# Patient Record
Sex: Female | Born: 1995 | Race: White | Hispanic: No | Marital: Single | Smoking: Never smoker
Health system: Southern US, Community
[De-identification: ages and names within clinical notes are randomized; demographics above are authoritative.]

## PROBLEM LIST (undated history)

## (undated) DIAGNOSIS — K589 Irritable bowel syndrome without diarrhea: Secondary | ICD-10-CM

---

## 2017-07-12 ENCOUNTER — Encounter (HOSPITAL_COMMUNITY): Payer: Self-pay | Admitting: Emergency Medicine

## 2017-07-12 ENCOUNTER — Emergency Department (HOSPITAL_COMMUNITY): Payer: BLUE CROSS/BLUE SHIELD

## 2017-07-12 ENCOUNTER — Other Ambulatory Visit: Payer: Self-pay

## 2017-07-12 ENCOUNTER — Emergency Department (HOSPITAL_COMMUNITY)
Admission: EM | Admit: 2017-07-12 | Discharge: 2017-07-12 | Disposition: A | Discharge status: Home / Self Care | Payer: BLUE CROSS/BLUE SHIELD | Attending: Emergency Medicine | Admitting: Emergency Medicine

## 2017-07-12 DIAGNOSIS — R112 Nausea with vomiting, unspecified: Secondary | ICD-10-CM | POA: Insufficient documentation

## 2017-07-12 DIAGNOSIS — R1110 Vomiting, unspecified: Secondary | ICD-10-CM | POA: Insufficient documentation

## 2017-07-12 DIAGNOSIS — K5900 Constipation, unspecified: Secondary | ICD-10-CM | POA: Diagnosis not present

## 2017-07-12 DIAGNOSIS — R1084 Generalized abdominal pain: Secondary | ICD-10-CM | POA: Diagnosis present

## 2017-07-12 HISTORY — DX: Irritable bowel syndrome, unspecified: K58.9

## 2017-07-12 LAB — COMPREHENSIVE METABOLIC PANEL
ALBUMIN: 4.4 g/dL (ref 3.5–5.0)
ALK PHOS: 45 U/L (ref 38–126)
ALT: 18 U/L (ref 14–54)
ANION GAP: 8 (ref 5–15)
AST: 25 U/L (ref 15–41)
BUN: 15 mg/dL (ref 6–20)
CALCIUM: 9 mg/dL (ref 8.9–10.3)
CO2: 26 mmol/L (ref 22–32)
Chloride: 104 mmol/L (ref 101–111)
Creatinine, Ser: 0.87 mg/dL (ref 0.44–1.00)
GFR calc Af Amer: >60 mL/min (ref >60)
GFR calc non Af Amer: >60 mL/min (ref >60)
GLUCOSE: 106 mg/dL — AB (ref 65–99)
Potassium: 3.9 mmol/L (ref 3.5–5.1)
SODIUM: 138 mmol/L (ref 135–145)
Total Bilirubin: 0.7 mg/dL (ref 0.3–1.2)
Total Protein: 6.7 g/dL (ref 6.5–8.1)

## 2017-07-12 LAB — CBC
HCT: 42.1 % (ref 36.0–46.0)
HEMOGLOBIN: 13.9 g/dL (ref 12.0–15.0)
MCH: 31.2 pg (ref 26.0–34.0)
MCHC: 33 g/dL (ref 30.0–36.0)
MCV: 94.4 fL (ref 78.0–100.0)
Platelets: 194 10*3/uL (ref 150–400)
RBC: 4.46 MIL/uL (ref 3.87–5.11)
RDW: 12.9 % (ref 11.5–15.5)
WBC: 10.3 10*3/uL (ref 4.0–10.5)

## 2017-07-12 LAB — URINALYSIS, ROUTINE W REFLEX MICROSCOPIC
Bilirubin Urine: NEGATIVE
GLUCOSE, UA: NEGATIVE mg/dL
Hgb urine dipstick: NEGATIVE
Ketones, ur: NEGATIVE mg/dL
Nitrite: NEGATIVE
PROTEIN: NEGATIVE mg/dL
Specific Gravity, Urine: 1.019 (ref 1.005–1.030)
pH: 6 (ref 5.0–8.0)

## 2017-07-12 LAB — LIPASE, BLOOD: Lipase: 23 U/L (ref 11–51)

## 2017-07-12 LAB — PREGNANCY, URINE: PREG TEST UR: NEGATIVE

## 2017-07-12 MED ORDER — SODIUM CHLORIDE 0.9 % IV BOLUS (SEPSIS)
1000.0000 mL | Freq: Once | INTRAVENOUS | Status: AC
  Administered 2017-07-12: 1000 mL via INTRAVENOUS

## 2017-07-12 MED ORDER — ONDANSETRON HCL 4 MG PO TABS: 4.0000 mg | ORAL_TABLET | Freq: Three times a day (TID) | ORAL | 0 refills | Status: AC | PRN

## 2017-07-12 MED ORDER — FLEET ENEMA 7-19 GM/118ML RE ENEM
1.0000 | ENEMA | Freq: Once | RECTAL | Status: AC
  Administered 2017-07-12: 1 via RECTAL
  Filled 2017-07-12: qty 1

## 2017-07-12 MED ORDER — PROMETHAZINE HCL 25 MG PO TABS
12.5000 mg | ORAL_TABLET | Freq: Once | ORAL | Status: AC
  Administered 2017-07-12: 12.5 mg via ORAL
  Filled 2017-07-12: qty 1

## 2017-07-12 MED ORDER — ONDANSETRON HCL 4 MG/2ML IJ SOLN
4.0000 mg | Freq: Once | INTRAMUSCULAR | Status: AC
  Administered 2017-07-12: 4 mg via INTRAVENOUS
  Filled 2017-07-12: qty 2

## 2017-07-12 MED ORDER — MORPHINE SULFATE (PF) 4 MG/ML IV SOLN
2.0000 mg | Freq: Once | INTRAVENOUS | Status: AC
  Administered 2017-07-12: 2 mg via INTRAVENOUS
  Filled 2017-07-12: qty 1

## 2017-07-12 NOTE — ED Triage Notes (Signed)
All over abd pain n/v since this am, has hx of IBS , last bm Monday then not again till this am and it was small, denies dysuria

## 2017-07-12 NOTE — ED Notes (Signed)
ED Provider at bedside. 

## 2017-07-12 NOTE — ED Provider Notes (Signed)
[facility] Provider Note   CSN: [id number] Arrival date & time: 07/12/17  1130     History   Chief Complaint Chief Complaint  Patient presents with  . Abdominal Pain    HPI Amy Sanchez is a 21 y.o. female.  HPI   Amy Sanchez is a 21 year old female with a history of IBS-C who presents emergency department for evaluation of generalized abdominal pain and vomiting.  States that abdominal pain woke her from sleep this morning.  Pain is generalized, 7/10 in severity and constant.  She describes pain as "cramping, burning, like my stomach is about to explode."  Pain is worsened with vomiting.  She states that it is improved with lying flat.  She had two episodes of vomitus this morning, denies hematemesis.  Took Zofran which she had at home and has not had vomiting since.  She states that she has been constipated, last bowel movement three days ago.  She does report that she had a small, hard stool which passed this morning.  Denies hematochezia or melena.  She states that she takes Linzess for her constipation daily which was prescribed to her by her gastroenterologist back in [place]. She [facility] [employer] [place]  Lives in the dorms, denies known sick contacts. She denies previous abdominal surgeries.  Denies fever, urinary frequency, dysuria, hematuria, vaginal discharge, chest pain, shortness of breath, headache, visual disturbance.  Past Medical History:  Diagnosis Date  . IBS (irritable bowel syndrome)     There are no active problems to display for this patient.   History reviewed. No pertinent surgical history.  OB History    No data available       Home Medications    Prior to Admission medications   Not on File    Family History No family history on file.  Social History Social History   Tobacco Use  . Smoking status: Never Smoker  . Smokeless tobacco: Never Used  Substance Use Topics  .  Alcohol use: Yes  . Drug use: No     Allergies   Patient has no known allergies.   Review of Systems Review of Systems  Constitutional: Negative for chills, fatigue and fever.  HENT: Negative for trouble swallowing.   Eyes: Negative for visual disturbance.  Respiratory: Negative for shortness of breath.   Cardiovascular: Negative for chest pain.  Gastrointestinal: Positive for abdominal pain, constipation, nausea and vomiting. Negative for blood in stool and diarrhea.  Genitourinary: Negative for difficulty urinating, dysuria, flank pain, frequency and hematuria.  Musculoskeletal: Negative for back pain.  Skin: Negative for rash.  Neurological: Negative for weakness, numbness and headaches.  Psychiatric/Behavioral: Negative for agitation.     Physical Exam Updated Vital Signs BP 104/65   Pulse (!) 53   Temp (!) 97.4 F (36.3 C) (Oral)   Resp 11   SpO2 100%   Physical Exam  Constitutional: She appears well-developed and well-nourished. No distress.  HENT:  Head: Normocephalic and atraumatic.  Mouth/Throat: Oropharynx is clear and moist. No oropharyngeal exudate.  Mucous membranes moist  Eyes: Pupils are equal, round, and reactive to light. Right eye exhibits no discharge. Left eye exhibits no discharge. No scleral icterus.  Cardiovascular: Normal rate, regular rhythm, normal heart sounds and intact distal pulses.  No murmur heard. Pulmonary/Chest: Effort normal. No stridor. No respiratory distress. She has no wheezes. She has no rales.  Abdominal: Soft. Normal appearance and bowel sounds are normal.  Abdomen non-distended. Grossly tender to palpation in all four quadrants and epigastrium. No guarding or rigidity. No rebound tenderness. Negative Murphy's sign. Negative McBurney's point. No CVA tenderness.   Neurological: She is alert. Coordination normal.  Skin: Skin is warm and dry. Capillary refill takes less than 2 seconds. She is not diaphoretic.  Psychiatric: She  has a normal mood and affect. Her behavior is normal.  Nursing note and vitals reviewed.   ED Treatments / Results  Labs (all labs ordered are listed, but only abnormal results are displayed) Labs Reviewed  URINALYSIS, ROUTINE W REFLEX MICROSCOPIC - Abnormal; Notable for the following components:      Result Value   APPearance HAZY (*)    Leukocytes, UA TRACE (*)    Bacteria, UA MANY (*)    Squamous Epithelial / LPF 0-5 (*)    All other components within normal limits  LIPASE, BLOOD  COMPREHENSIVE METABOLIC PANEL  CBC  POC URINE PREG, ED    EKG  EKG Interpretation None       Radiology No results found.  Procedures Procedures (including critical care time)  Medications Ordered in ED Medications  sodium chloride 0.9 % bolus 1,000 mL (not administered)  ondansetron (ZOFRAN) injection 4 mg (not administered)     Initial Impression / Assessment and Plan / ED Course  I have reviewed the triage vital signs and the nursing notes.  Pertinent labs & imaging results that were available during my care of the patient were reviewed by me and considered in my medical decision making (see chart for details).  Clinical Course as of Jul 12 1505  Thu Jul 12, 2017  1405 Patient is requesting an enema  [ES]  1506 Patient had a bowel movement with enema, states that she feels better.   [ES]    Clinical Course User Index [ES] Kellie ShropshireShrosbree, Emily J, PA-C    Patient's abdominal pain is generalized, with history of prior from IBS flare. States that she has not passed a bowel movement in several days now. Abdomen generally tender, no guarding or rigidity. No peritoneal signs. VSS. Do not suspect surgical abdomen given exam. Given her history, suspect that she is having pain from constipation. Will order basic labs, and KUB to evaluate bowel gas pattern. Patient given IV fluids, anti-emetics.  Labs reviewed. CBC and CMP unremarkable. Urine pregnancy negative. Lipase negative. UA with trace  leuks, many bacteria and squamous cells present. Likely contaminated given patient denies urinary symptoms.    KUB reveals large left sided stool burden, no evidence of obstruction. On further discussion, patient states that her GI doctor added Mirilax to her bowel regimen weeks ago which she never got around to doing. She is requesting enema. Enema ordered and patient had a bowel movement, states she is feeling better. Able to tolerate PO fluids at the bedside. Have discussed follow up with her GI doctor and adding Mirilax to her daily bowel regimen. Patient agrees and voices understanding. Discussed return precautions and patient agrees. Discussed this patient with Dr. Denton LankSteinl who also saw the patient and agrees to above plan.    Final Clinical Impressions(s) / ED Diagnoses   Final diagnoses:  Constipation, unspecified constipation type    ED Discharge Orders        Ordered    ondansetron (ZOFRAN) 4 MG tablet  Every 8 hours PRN     07/12/17 1518       Kellie ShropshireShrosbree, Emily J, PA-C 07/13/17 1014    Cathren LaineSteinl, Kevin, MD 07/15/17  1452  

## 2017-07-12 NOTE — Discharge Instructions (Signed)
Your x-ray showed large stool burden.  Lab work was reassuring.  Please take MiraLAX in addition to Linzess for your constipation.  Follow-up with your GI doctor for further IBS and constipation concerns.  Return to the emergency department if you have worsening abdominal pain with fever greater than 100.4 F, vomiting that will not stop despite taking Zofran.  Please also return for any new or worsening symptoms.

## 2017-07-13 ENCOUNTER — Encounter (HOSPITAL_COMMUNITY): Payer: Self-pay | Admitting: Emergency Medical Services

## 2018-09-06 IMAGING — CR DG ABDOMEN 1V
2 series · 2 of 2 positions shown · non-contrast
Comparison: None.

CLINICAL DATA: Abdominal pain and constipation.

EXAM:
ABDOMEN - 1 VIEW

[abdomen kub (1 of 2)]
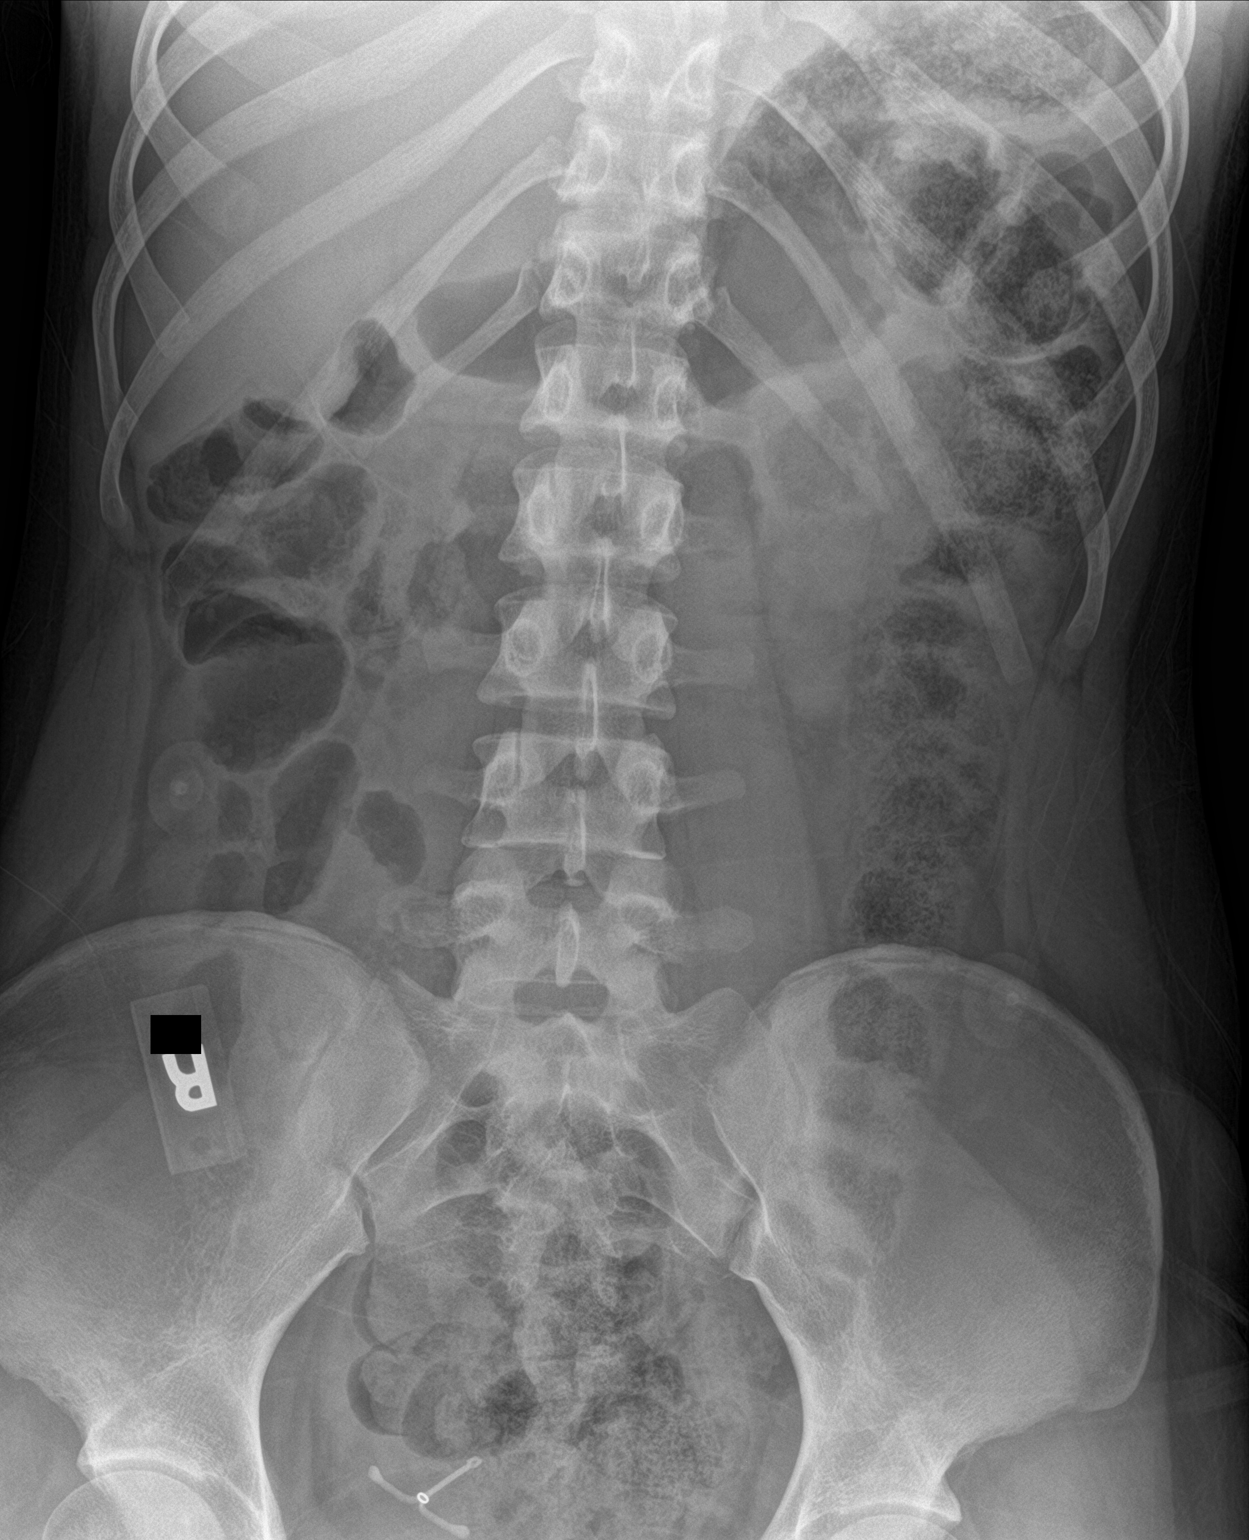

[abdomen kub (2 of 2)]
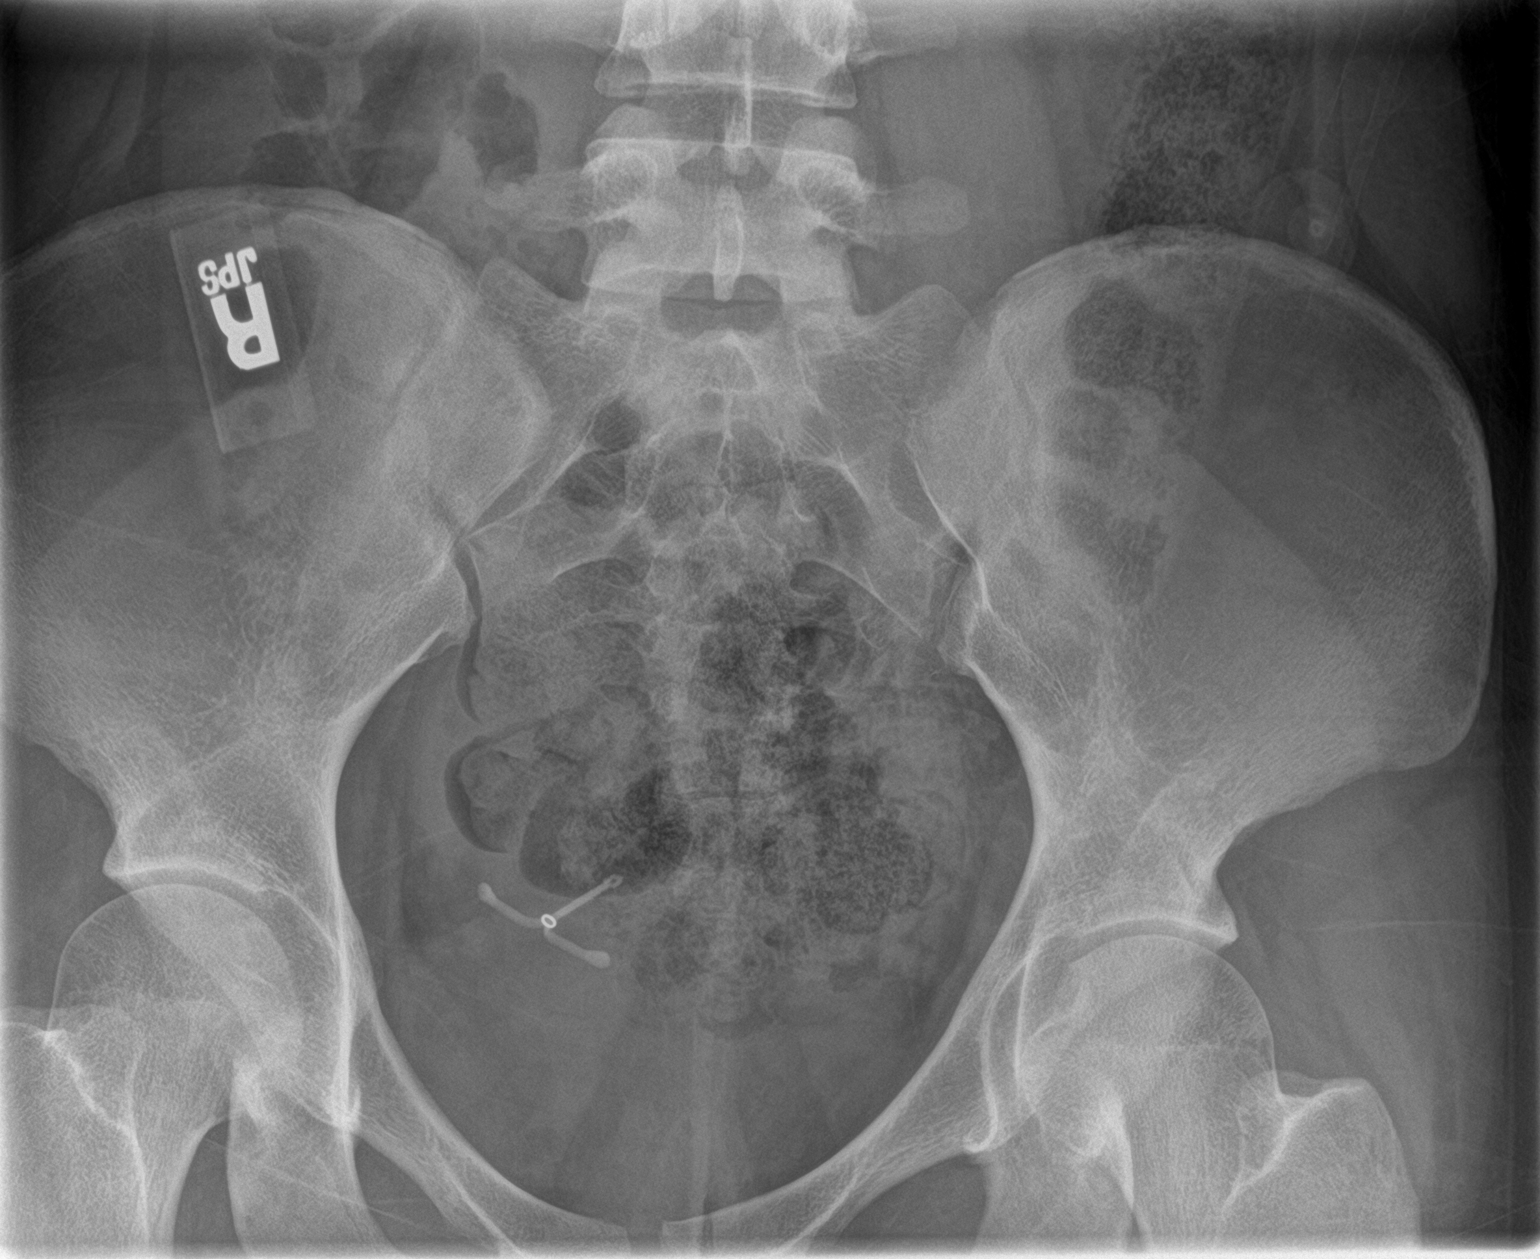

[2 of 2 positions shown; findings below may reference images not displayed]

FINDINGS: The bowel gas pattern is normal. Large left-sided colonic stool
burden. No radio-opaque calculi or other significant radiographic
abnormality are seen. IUD in the pelvis.
IMPRESSION: Large left-sided colonic stool burden, most suggestive of
constipation. No evidence of obstruction.
